# Patient Record
Sex: Male | Born: 1985 | Hispanic: Yes | Marital: Single | Smoking: Never smoker
Health system: Southern US, Community
[De-identification: ages and names within clinical notes are randomized; demographics above are authoritative.]

---

## 2018-01-02 ENCOUNTER — Emergency Department (HOSPITAL_COMMUNITY): Payer: Managed Care, Other (non HMO)

## 2018-01-02 ENCOUNTER — Other Ambulatory Visit: Payer: Self-pay

## 2018-01-02 ENCOUNTER — Emergency Department (HOSPITAL_COMMUNITY)
Admission: EM | Admit: 2018-01-02 | Discharge: 2018-01-02 | Disposition: A | Discharge status: Home / Self Care | Payer: Managed Care, Other (non HMO) | Attending: Emergency Medicine | Admitting: Emergency Medicine

## 2018-01-02 ENCOUNTER — Encounter (HOSPITAL_COMMUNITY): Payer: Self-pay

## 2018-01-02 DIAGNOSIS — R002 Palpitations: Secondary | ICD-10-CM | POA: Diagnosis not present

## 2018-01-02 DIAGNOSIS — R079 Chest pain, unspecified: Secondary | ICD-10-CM | POA: Diagnosis present

## 2018-01-02 DIAGNOSIS — Z79899 Other long term (current) drug therapy: Secondary | ICD-10-CM | POA: Insufficient documentation

## 2018-01-02 LAB — CBC
HCT: 50.8 % (ref 39.0–52.0)
Hemoglobin: 16.5 g/dL (ref 13.0–17.0)
MCH: 29 pg (ref 26.0–34.0)
MCHC: 32.5 g/dL (ref 30.0–36.0)
MCV: 89.3 fL (ref 78.0–100.0)
PLATELETS: 302 10*3/uL (ref 150–400)
RBC: 5.69 MIL/uL (ref 4.22–5.81)
RDW: 13.4 % (ref 11.5–15.5)
WBC: 10.1 10*3/uL (ref 4.0–10.5)

## 2018-01-02 LAB — BASIC METABOLIC PANEL
Anion gap: 10 (ref 5–15)
BUN: 16 mg/dL (ref 6–20)
CALCIUM: 9.9 mg/dL (ref 8.9–10.3)
CO2: 23 mmol/L (ref 22–32)
CREATININE: 1.21 mg/dL (ref 0.61–1.24)
Chloride: 104 mmol/L (ref 101–111)
GFR calc non Af Amer: >60 mL/min (ref >60)
Glucose, Bld: 144 mg/dL — ABNORMAL HIGH (ref 65–99)
Potassium: 4.2 mmol/L (ref 3.5–5.1)
SODIUM: 137 mmol/L (ref 135–145)

## 2018-01-02 LAB — I-STAT TROPONIN, ED: TROPONIN I, POC: 0.01 ng/mL (ref 0.00–0.08)

## 2018-01-02 MED ORDER — ONDANSETRON 4 MG PO TBDP
4.0000 mg | ORAL_TABLET | Freq: Once | ORAL | Status: AC
  Administered 2018-01-02: 4 mg via ORAL
  Filled 2018-01-02: qty 1

## 2018-01-02 NOTE — ED Notes (Signed)
Pt tolerated PO challenge well. No nausea at present time

## 2018-01-02 NOTE — ED Notes (Signed)
Pt given saltines and water for PO challenge 

## 2018-01-02 NOTE — ED Provider Notes (Signed)
[facility] Provider Note   CSN: [id number] Arrival date & time: 01/02/18  1352     History   Chief Complaint Chief Complaint  Patient presents with  . Chest Pain    HPI Margaretmary LombardMartin Storts is a 32 y.o. male.  Tried to eat today for the first time since ingesting cocaine, became nauseous with epigastric abdominal discomfort, palpitations. Symptoms now all resolved.  The history is provided by the patient.  Palpitations   This is a new problem. The current episode started 3 to 5 hours ago. The problem has been resolved. The problem is associated with cocaine (snorted cocaine >24 hours ago, sudden onset palpitations at 1200 today). On average, each episode lasts 30 minutes. Associated symptoms include abdominal pain, nausea, dizziness and shortness of breath. Pertinent negatives include no fever, no chest pain, no vomiting, no back pain and no cough. Associated symptoms comments: Paresthesias to hands. He has tried nothing for the symptoms.    History reviewed. No pertinent past medical history.  There are no active problems to display for this patient.   History reviewed. No pertinent surgical history.    Home Medications    Prior to Admission medications   Medication Sig Start Date End Date Taking? Authorizing Provider  omega-3 fish oil (MAXEPA) 1000 MG CAPS capsule Take 1 capsule by mouth daily.   Yes [provider]    Family History History reviewed. No pertinent family history.  Social History Social History   Tobacco Use  . Smoking status: Never Smoker  . Smokeless tobacco: Never Used  Substance Use Topics  . Alcohol use: Yes  . Drug use: Yes     Allergies   Patient has no known allergies.   Review of Systems Review of Systems  Constitutional: Negative for chills and fever.  HENT: Negative for ear pain and sore throat.   Eyes: Negative for pain and visual disturbance.  Respiratory: Positive for  shortness of breath. Negative for cough.   Cardiovascular: Positive for palpitations. Negative for chest pain.  Gastrointestinal: Positive for abdominal pain and nausea. Negative for vomiting.  Genitourinary: Negative for dysuria and hematuria.  Musculoskeletal: Negative for arthralgias and back pain.  Skin: Negative for color change and rash.  Neurological: Positive for dizziness. Negative for seizures and syncope.  All other systems reviewed and are negative.    Physical Exam Updated Vital Signs BP 137/85   Pulse 84   Temp 99.8 F (37.7 C) (Oral)   Resp 15   SpO2 97%   Physical Exam  Constitutional: He is oriented to person, place, and time. He appears well-developed and well-nourished.  HENT:  Head: Normocephalic and atraumatic.  Mouth/Throat: Oropharynx is clear and moist.  Eyes: Conjunctivae are normal.  Neck: Neck supple.  Cardiovascular: Normal rate, regular rhythm, normal heart sounds and intact distal pulses. Exam reveals no friction rub.  No murmur heard. Pulmonary/Chest: Effort normal and breath sounds normal. No stridor. No respiratory distress.  Abdominal: Soft. He exhibits no distension. There is no tenderness.  Musculoskeletal: He exhibits no edema.  Neurological: He is alert and oriented to person, place, and time.  Skin: Skin is warm and dry.  Psychiatric: He has a normal mood and affect.  Nursing note and vitals reviewed.    ED Treatments / Results  Labs (all labs ordered are listed, but only abnormal results are displayed) Labs Reviewed  BASIC METABOLIC PANEL - Abnormal; Notable for the following components:      Result  Value   Glucose, Bld 144 (*)    All other components within normal limits  CBC  I-STAT TROPONIN, ED    EKG EKG Interpretation  Date/Time:  Monday January 02 2018 13:58:02 EDT Ventricular Rate:  98 PR Interval:  132 QRS Duration: 84 QT Interval:  292 QTC Calculation: 372 R Axis:   78 Text Interpretation:  Normal sinus rhythm  with sinus arrhythmia Non-specific ST-t changes No previous tracing Confirmed by Cathren Laine ([id number]) on 01/02/2018 4:40:05 PM   Radiology Dg Chest 2 View  Result Date: 01/02/2018 CLINICAL DATA:  Chest pain. EXAM: CHEST - 2 VIEW COMPARISON:  None. FINDINGS: The heart size and mediastinal contours are within normal limits. Both lungs are clear. No pneumothorax or pleural effusion is noted. The visualized skeletal structures are unremarkable. IMPRESSION: No active cardiopulmonary disease. Electronically Signed   By: Lupita Raider, M.D.   On: 01/02/2018 14:38    Procedures Procedures (including critical care time)  Medications Ordered in ED Medications  ondansetron (ZOFRAN-ODT) disintegrating tablet 4 mg (4 mg Oral Given 01/02/18 1619)     Initial Impression / Assessment and Plan / ED Course  I have reviewed the triage vital signs and the nursing notes.  Pertinent labs & imaging results that were available during my care of the patient were reviewed by me and considered in my medical decision making (see chart for details).     Ky Rumple is a 32 y.o. male with PMHx of cocaine abuse who p/w sudden onset nausea, palpitations when trying to eat for the first time after ingestion of cocaine yesterday evening. Reviewed and confirmed nursing documentation for past medical history, family history, social history. VS afebrile, wnl. Exam unremarkable. States palpitations were accompanied by dyspnea, tingling to hands, suspect hyperventilation, question panic attack. No chest pain, no symptoms currently to suggest ACS.   EKG with no acute ischemic changes. Trop neg. CBC, BMP wnl. CXR neg. Tolerating PO, remains symptom free after ODT zofran. Counseled against future cocaine use.   Old records reviewed. Labs reviewed by me and used in the medical decision making.  Imaging viewed and interpreted by me and used in the medical decision making (formal interpretation from radiologist). EKG reviewed  by me and used in the medical decision making. D/c home in stable condition, return precautions discussed. Patient agreeable with plan for d/c home.    Final Clinical Impressions(s) / ED Diagnoses   Final diagnoses:  Palpitations    ED Discharge Orders    None       Diannia Ruder, MD 01/02/18 1751    Cathren Laine, MD 01/02/18 1939

## 2018-01-02 NOTE — ED Triage Notes (Signed)
[facility]- pt coming from work with complaint of chest pain. He began having chest pain, epigastric pain. Pt states he used some type of white substance over the weekend. Pt alert and oriented. Hx of same 1 year ago. Pt pain free with EMS.   18g RAC 110 HR 122/86 18rr 100 SPO2%

## 2019-02-23 IMAGING — CR DG CHEST 2V
2 series · 2 of 2 positions shown · non-contrast
Comparison: None.

CLINICAL DATA: Chest pain.

EXAM:
CHEST - 2 VIEW

[chest lat]
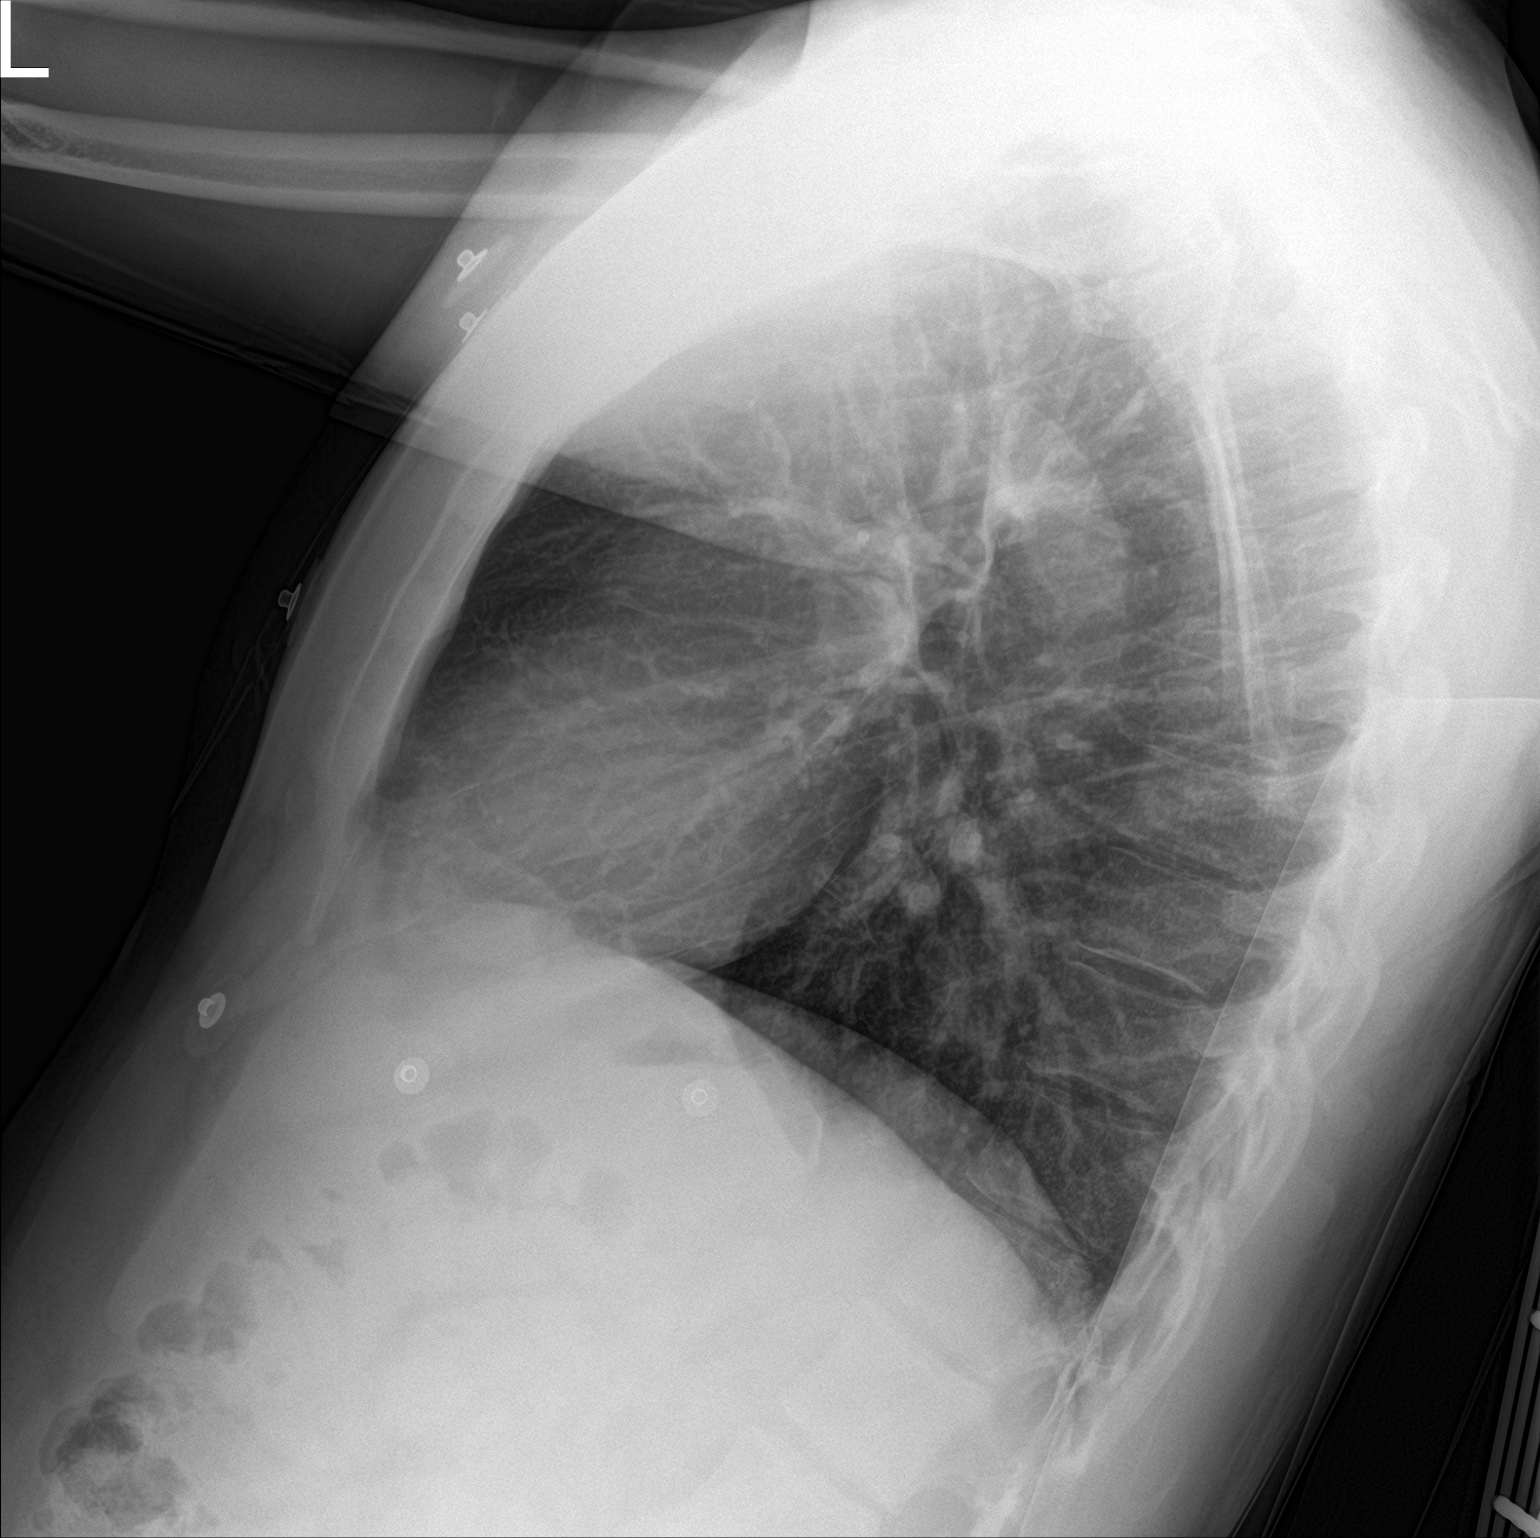

[chest ap]
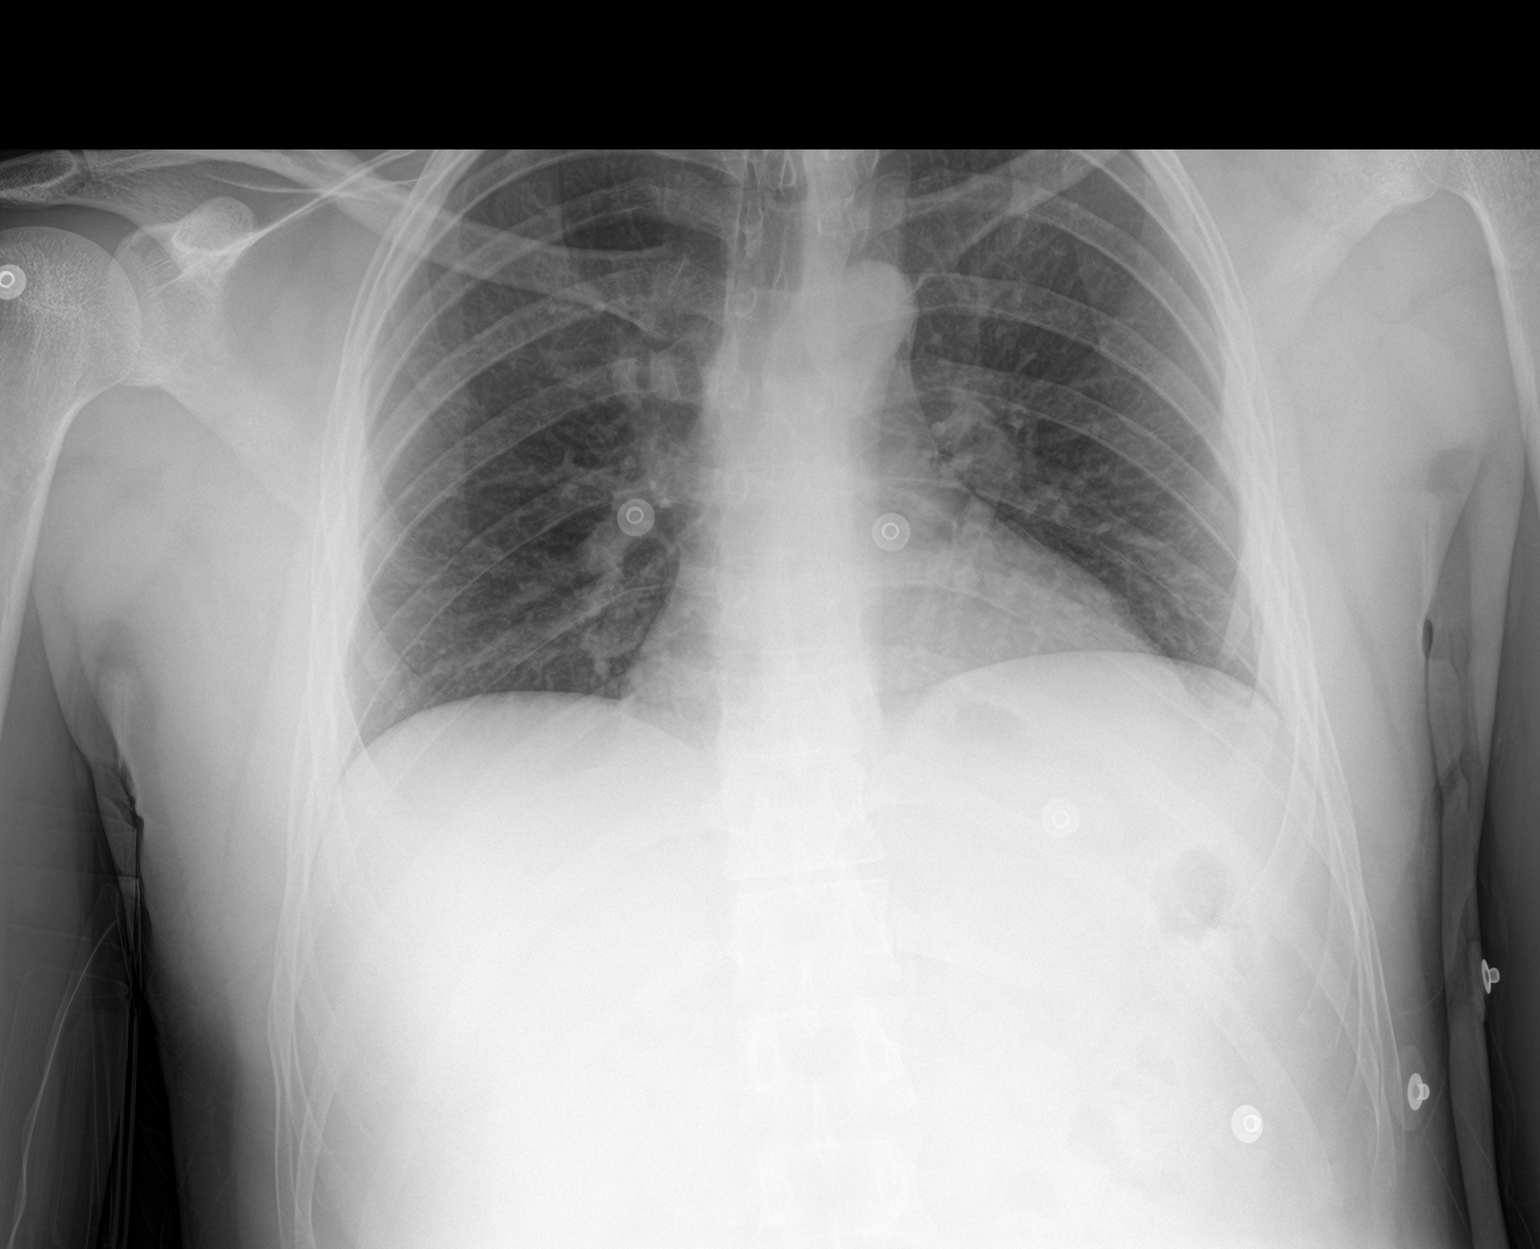

[2 of 2 positions shown; findings below may reference images not displayed]

FINDINGS: The heart size and mediastinal contours are within normal limits.
Both lungs are clear. No pneumothorax or pleural effusion is noted.
The visualized skeletal structures are unremarkable.
IMPRESSION: No active cardiopulmonary disease.

## 2019-06-25 ENCOUNTER — Other Ambulatory Visit (INDEPENDENT_AMBULATORY_CARE_PROVIDER_SITE_OTHER): Payer: Self-pay | Attending: Sports Medicine

## 2019-06-25 ENCOUNTER — Telehealth (INDEPENDENT_AMBULATORY_CARE_PROVIDER_SITE_OTHER): Admitting: Sports Medicine

## 2019-06-25 ENCOUNTER — Other Ambulatory Visit (INDEPENDENT_AMBULATORY_CARE_PROVIDER_SITE_OTHER): Payer: Self-pay | Admitting: Sports Medicine

## 2019-06-25 DIAGNOSIS — Z20828 Contact with and (suspected) exposure to other viral communicable diseases: Secondary | ICD-10-CM | POA: Insufficient documentation

## 2019-06-25 NOTE — Progress Notes (Signed)
Telehealth visit-  Covid Response Team    Department: [facility]    Date symptom onset: 36/75    33 year old year old male who present via telehealth chat for evaluation of possible Covid-19.  Patient states started with runny nose and nasal congestion and HA.  Hot/cold flashes.  No fever.  Body ache.  +sneezing.   Eyes are "hot".      Fever: -  Cough: +  SOB: -  Body ache: +  Headache: +  Sore throat: +  Congestion/runny nose: +  Fatigue: +  Nausea/vomiting: -  Diarrhea: -  Loss taste/smell: -    Known exposure: -    Recent travel: none    PMH: None-    Meds: None-    Allergies: NKDA    VS-  No vitals obtainable by patient    UEA:VWU9 nad nontoxic  Respiratory- Speaks in full sentences.  No audible wheezing.  No visual signs of respiratory distress.  Psyche: Normal affect and response to questions    There are no diagnoses linked to this encounter.     Suspected Covid-19 infection        No red flags; no distress or concerning s/sx.  Recommend Rest, fluids, social distancing, hand washing, flu precautions, etc.   Pt instructed to e-mail [email or web address] if not improving in 4-6 days, or if worse.   Handout/e-mail provided via MyChart access.  ER precautions reviewed and patient verbalized clear understanding.   The patient stated understanding, and is in agreement with the plan. All questions were answered.    Patient is temporarily totally disabled.    Evelina Dun, MA in attendance for entirety of visit

## 2019-06-26 ENCOUNTER — Telehealth (INDEPENDENT_AMBULATORY_CARE_PROVIDER_SITE_OTHER): Admitting: Sports Medicine

## 2019-06-26 LAB — COVID-19 CORONAVIRUS DETECTION ASSAY AT ~~LOC~~ LAB: COVID-19 Coronavirus Result: DETECTED — AB

## 2019-06-26 NOTE — Progress Notes (Signed)
State of [place]  Primary Treating Physician's Progress Report (PR-2)    Check the boxes which indicate why you are submitting a report at this time. If the patient is "Permanent and Stationary" (i.e., has reached maximum medical improvement), do not use this form. You may use [facility] Forms PR-3 or PR-4.    Reason for Report:  Change in patient's condition    Patient:     Last Name: Jonathan Chavez  FirstDaphine Chavez  Sex: male  Address: [address] [place] [address]  Date of Injury:  06/24/2019   Date of Birth: Jul 05, 1986    Occupation: [employer] SS #: [id number]    Phone:  [phone]     Claims Administrator:    Claims Administrator Name:       Claim Number:    Claims Administrator Address:      Claims Administrator City:       State:      Zip:      Phone: [phone]  Fax:   Employer Name: [employer]  Employer [phone]     Employer Address:   Employer:  [address]  [place] [address]    [employer] of Business (e.g. Risk analyst, Games developer, Forensic scientist): [employer]      The information below must be provided. You may use this form or you may substitute or append a narrative report.    Subjective Complaints:   12/8-  Department: [facility]    Date symptom onset: 17/83    33 year old year old male who present via telehealth chat for evaluation of possible Covid-19.  Patient states started with runny nose and nasal congestion and HA.  Hot/cold flashes.  No fever.  Body ache.  +sneezing.   Eyes are "hot".      Fever: -  Cough: +  SOB: -  Body ache: +  Headache: +  Sore throat: +  Congestion/runny nose: +  Fatigue: +  Nausea/vomiting: -  Diarrhea: -  Loss taste/smell: -    12/6- Pt states feeling better.  States just has nasal congestion and runny nose.  No f/c/cough/SOB.      Objective Findings:  (Include significant physical examination, laboratory, imaging, or other diagnostic findings.)    NUU:VOZ3   Respiratory- Speaks in full sentences.  No audible wheezing.   Psyche: Normal affect and response to questions    Diagnosis:     ICD-10-CM ICD-9-CM    1. COVID-19 virus infection  U07.1 079.89          Treatment Plan:  (Include treatment rendered to date. List methods, frequency and duration of planned treatment(s). Specify consultation/referral, surgery, and hospitalization. Identify each physician and non-physician provider. Specify  type, frequency and duration of physical medicine services (e.g., physical therapy, manipulation, acupuncture). Use of CPT codes is encouraged. Have there been any changes in treatment plan? If so, why?    No red flags; no distress or concerning s/sx.  Recommend Rest, fluids, social distancing, hand washing, flu precautions, etc.   Pt instructed to e-mail [email or web address] if not improving in 4-6 days, or if worse.   Handout provided via MyChart access.  ER precautions reviewed and patient verbalized clear understanding.   The patient stated understanding, and is in agreement with the plan. All questions were answered.     Patient is temporarily permanently disabled.     Midge Aver, MA in attendance for entirety of visit    Work Status:  TTD    Authorization:   None    Follow up:   3-4 days      Primary Treating Physician:       Date of Exam: 06/26/2019    I declare under penalty of perjury that this report is true and correct to the best of my knowledge and that I have not violated Labor Code 139.3.    [place] License Number:  [id number]  Executed at [facility] Date:  06/26/2019  Physician Name:  Alver Sorrow, DO , Specialty:  [facility]  Physician Address:  [address]  [address]  [place] [address]                               Phone Number:  [phone]

## 2019-07-02 ENCOUNTER — Telehealth (INDEPENDENT_AMBULATORY_CARE_PROVIDER_SITE_OTHER): Admitting: Sports Medicine

## 2019-07-02 NOTE — Progress Notes (Signed)
State of [place]  Primary Treating Physician's Progress Report (PR-2)    Check the boxes which indicate why you are submitting a report at this time. If the patient is "Permanent and Stationary" (i.e., has reached maximum medical improvement), do not use this form. You may use [facility] Forms PR-3 or PR-4.    Reason for Report:  Change in patient's condition    Patient:     Last Name: Jonathan Chavez  FirstDaphine Chavez  Sex: male  Address: [address] [place] [address]  Date of Injury:  06/24/2019   Date of Birth: 01/05/1986    Occupation: [employer] SS #: [id number]    Phone:  [phone]     Claims Administrator:    Claims Administrator Name:       Claim Number:    Claims Administrator Address:      Claims Administrator City:       State:      Zip:      Phone: [phone]  Fax:   Employer Name: [employer]  Employer Phone:[phone]     Employer Address:   Employer:  [address]  [place] [address]    [employer] of Business (e.g. Risk analyst, Games developer, Forensic scientist): [employer]      The information below must be provided. You may use this form or you may substitute or append a narrative report.    Subjective Complaints:   12/8-  Department: [facility]PD    Date symptom onset: 40/76    33 year old year old male who present via telehealth chat for evaluation of possible Covid-19.  Patient states started with runny nose and nasal congestion and HA.  Hot/cold flashes.  No fever.  Body ache.  +sneezing.   Eyes are "hot".      Fever: -  Cough: +  SOB: -  Body ache: +  Headache: +  Sore throat: +  Congestion/runny nose: +  Fatigue: +  Nausea/vomiting: -  Diarrhea: -  Loss taste/smell: -    12/6- Pt states feeling better.  States just has nasal congestion and runny nose.  No f/c/cough/SOB.      12/14- Pt reports lost taste and smell.  States still has runny nose and congestion.  No cough.  No f/c/body ache.      Objective Findings:  (Include significant physical examination,  laboratory, imaging, or other diagnostic findings.)    NUU:VOZ3   Respiratory- Speaks in full sentences.  No audible wheezing.  Psyche: Normal affect and response to questions    Diagnosis:     ICD-10-CM ICD-9-CM    1. COVID-19 virus infection  U07.1 079.89          Treatment Plan:  (Include treatment rendered to date. List methods, frequency and duration of planned treatment(s). Specify consultation/referral, surgery, and hospitalization. Identify each physician and non-physician provider. Specify  type, frequency and duration of physical medicine services (e.g., physical therapy, manipulation, acupuncture). Use of CPT codes is encouraged. Have there been any changes in treatment plan? If so, why?    12/14 - Pt much improved.  Permanent and stationary.  Mask donning and social distancing stressed.  Patient to recheck with any symptoms.    Work Status:   Full    Authorization:   None    Follow up:   None    Darci Current, MA in attendance for entirety of the visit.      Primary Treating Physician:  Date of Exam: 06/26/2019    I declare under penalty of perjury that this report is true and correct to the best of my knowledge and that I have not violated Labor Code 139.3.    [place] License Number:  [id number]  Executed at [facility] Date:  06/26/2019  Physician Name:  Alver Sorrow, DO , Specialty:  [facility]  Physician Address:  [address]  [address]  [place] [address]                               Phone Number:  [phone]

## 2019-11-08 ENCOUNTER — Encounter (INDEPENDENT_AMBULATORY_CARE_PROVIDER_SITE_OTHER): Payer: Self-pay | Admitting: Otolaryngology

## 2021-03-07 ENCOUNTER — Other Ambulatory Visit: Payer: Self-pay | Admitting: Internal Medicine

## 2021-03-07 ENCOUNTER — Other Ambulatory Visit: Admit: 2021-03-07 | Discharge: 2021-03-07 | Disposition: A | Discharge status: Home / Self Care | Payer: Self-pay

## 2021-03-07 ENCOUNTER — Other Ambulatory Visit (INDEPENDENT_AMBULATORY_CARE_PROVIDER_SITE_OTHER): Payer: Self-pay

## 2021-03-07 ENCOUNTER — Other Ambulatory Visit (INDEPENDENT_AMBULATORY_CARE_PROVIDER_SITE_OTHER): Payer: Commercial Managed Care - PPO | Attending: Acute Care

## 2021-03-07 DIAGNOSIS — Z1159 Encounter for screening for other viral diseases: Secondary | ICD-10-CM

## 2021-03-07 LAB — COVID-19 DETECTION ASSAY SYM: COVID-19 Coronavirus Result: DETECTED — AB

## 2021-03-08 ENCOUNTER — Telehealth: Payer: Self-pay

## 2021-03-08 NOTE — Telephone Encounter (Signed)
Positive COVID-19 PCR: 3rd Party- [employer]      Lab Results   Component Value Date    CO19 DETECTED (A) 03/07/2021    CO19 DETECTED (A) 06/25/2019          POSITIVE RESULTS SENT IN [facility] MESSAGE.     Vallery Ridge, RN  Signature Derived From [employer], 03/08/21  6:30 AM

## 2021-08-28 LAB — COVID-19 CORONAVIRUS VIRAL SEQUENCING (INVESTIGATIONAL ONLY)
